# Patient Record
Sex: Female | Born: 1975 | Race: Black or African American | Hispanic: No | Marital: Married | Smoking: Current every day smoker
Health system: Southern US, Community
[De-identification: ages and names within clinical notes are randomized; demographics above are authoritative.]

## PROBLEM LIST (undated history)

## (undated) HISTORY — PX: ABDOMINAL HYSTERECTOMY: SHX81

---

## 2017-04-03 ENCOUNTER — Emergency Department (HOSPITAL_COMMUNITY)
Admission: EM | Admit: 2017-04-03 | Discharge: 2017-04-03 | Disposition: A | Discharge status: Home / Self Care | Attending: Emergency Medicine | Admitting: Emergency Medicine

## 2017-04-03 ENCOUNTER — Emergency Department (HOSPITAL_COMMUNITY)

## 2017-04-03 ENCOUNTER — Encounter (HOSPITAL_COMMUNITY): Payer: Self-pay | Admitting: Nurse Practitioner

## 2017-04-03 DIAGNOSIS — J111 Influenza due to unidentified influenza virus with other respiratory manifestations: Secondary | ICD-10-CM | POA: Insufficient documentation

## 2017-04-03 DIAGNOSIS — F17210 Nicotine dependence, cigarettes, uncomplicated: Secondary | ICD-10-CM | POA: Diagnosis not present

## 2017-04-03 DIAGNOSIS — R1110 Vomiting, unspecified: Secondary | ICD-10-CM | POA: Diagnosis present

## 2017-04-03 DIAGNOSIS — R6889 Other general symptoms and signs: Secondary | ICD-10-CM

## 2017-04-03 LAB — CBC
HEMATOCRIT: 43 % (ref 36.0–46.0)
HEMOGLOBIN: 14.2 g/dL (ref 12.0–15.0)
MCH: 28.2 pg (ref 26.0–34.0)
MCHC: 33 g/dL (ref 30.0–36.0)
MCV: 85.3 fL (ref 78.0–100.0)
Platelets: 254 10*3/uL (ref 150–400)
RBC: 5.04 MIL/uL (ref 3.87–5.11)
RDW: 14.9 % (ref 11.5–15.5)
WBC: 6.2 10*3/uL (ref 4.0–10.5)

## 2017-04-03 LAB — URINALYSIS, ROUTINE W REFLEX MICROSCOPIC
Bilirubin Urine: NEGATIVE
Glucose, UA: NEGATIVE mg/dL
Ketones, ur: NEGATIVE mg/dL
NITRITE: NEGATIVE
PH: 6 (ref 5.0–8.0)
Protein, ur: NEGATIVE mg/dL
SPECIFIC GRAVITY, URINE: 1.014 (ref 1.005–1.030)

## 2017-04-03 LAB — COMPREHENSIVE METABOLIC PANEL
ALBUMIN: 3.3 g/dL — AB (ref 3.5–5.0)
ALK PHOS: 87 U/L (ref 38–126)
ALT: 38 U/L (ref 14–54)
ANION GAP: 13 (ref 5–15)
AST: 71 U/L — AB (ref 15–41)
BILIRUBIN TOTAL: 0.5 mg/dL (ref 0.3–1.2)
BUN: 7 mg/dL (ref 6–20)
CALCIUM: 8.4 mg/dL — AB (ref 8.9–10.3)
CO2: 22 mmol/L (ref 22–32)
Chloride: 106 mmol/L (ref 101–111)
Creatinine, Ser: 0.7 mg/dL (ref 0.44–1.00)
GFR calc Af Amer: >60 mL/min (ref >60)
GFR calc non Af Amer: >60 mL/min (ref >60)
GLUCOSE: 85 mg/dL (ref 65–99)
Potassium: 3.8 mmol/L (ref 3.5–5.1)
SODIUM: 141 mmol/L (ref 135–145)
TOTAL PROTEIN: 6.8 g/dL (ref 6.5–8.1)

## 2017-04-03 LAB — I-STAT BETA HCG BLOOD, ED (MC, WL, AP ONLY)

## 2017-04-03 LAB — LIPASE, BLOOD: Lipase: 26 U/L (ref 11–51)

## 2017-04-03 LAB — RAPID STREP SCREEN (MED CTR MEBANE ONLY): Streptococcus, Group A Screen (Direct): NEGATIVE

## 2017-04-03 MED ORDER — GUAIFENESIN-CODEINE 100-10 MG/5ML PO SOLN: 5.0000 mL | Freq: Every evening | ORAL | 0 refills | Status: AC | PRN

## 2017-04-03 MED ORDER — PREDNISONE 10 MG PO TABS: 20.0000 mg | ORAL_TABLET | Freq: Two times a day (BID) | ORAL | 0 refills | Status: AC

## 2017-04-03 MED ORDER — HYDROCOD POLST-CPM POLST ER 10-8 MG/5ML PO SUER
5.0000 mL | Freq: Once | ORAL | Status: AC
  Administered 2017-04-03: 5 mL via ORAL
  Filled 2017-04-03: qty 5

## 2017-04-03 MED ORDER — ONDANSETRON 4 MG PO TBDP: 4.0000 mg | ORAL_TABLET | Freq: Once | ORAL | Status: DC | PRN

## 2017-04-03 MED ORDER — BENZONATATE 100 MG PO CAPS: 100.0000 mg | ORAL_CAPSULE | Freq: Three times a day (TID) | ORAL | 0 refills | Status: AC

## 2017-04-03 NOTE — ED Provider Notes (Signed)
[facility] EMERGENCY DEPARTMENT Provider Note   CSN: [id number] Arrival date & time: 04/03/17  1103     History   Chief Complaint Chief Complaint  Patient presents with  . Emesis    HPI Yvonne Byrd is a 42 y.o. female who is an every day smoker and hx of abdominal hysterectomy 10/19 presents to the ED with URI symptoms that started 5 days ago and have gotten worse. Patient reports sneezing, coughing, nasal congestion and feeling tired. Patient also reports vomiting with cough only. Patient visiting here from [place].  The history is provided by the patient. No language interpreter was used.  URI   This is a new problem. The current episode started more than 1 week ago. There has been no fever. Associated symptoms include vomiting (with couhg), congestion, ear pain, headaches, plugged ear sensation, rhinorrhea, sneezing, sore throat and cough. Pertinent negatives include no abdominal pain, no nausea, no dysuria, no sinus pain and no rash. She has tried other medications for the symptoms. The treatment provided no relief.    History reviewed. No pertinent past medical history.  There are no active problems to display for this patient.   Past Surgical History:  Procedure Laterality Date  . ABDOMINAL HYSTERECTOMY      OB History    No data available       Home Medications    Prior to Admission medications   Medication Sig Start Date End Date Taking? Authorizing Provider  benzonatate (TESSALON) 100 MG capsule Take 1 capsule (100 mg total) by mouth every 8 (eight) hours. 04/03/17   Janne Napoleon, NP  guaiFENesin-codeine 100-10 MG/5ML syrup Take 5 mLs by mouth at bedtime as needed and may repeat dose one time if needed for cough. 04/03/17   Janne Napoleon, NP  predniSONE (DELTASONE) 10 MG tablet Take 2 tablets (20 mg total) by mouth 2 (two) times daily with a meal. 04/03/17   Janne Napoleon, NP    Family History No family history on file.  Social  History Social History   Tobacco Use  . Smoking status: Current Every Day Smoker    Packs/day: 0.50  . Smokeless tobacco: Never Used  Substance Use Topics  . Alcohol use: Yes    Alcohol/week: 16.8 oz    Types: 28 Cans of beer per week    Frequency: Never  . Drug use: No     Allergies   Asa [aspirin] and Penicillins   Review of Systems Review of Systems  Constitutional: Negative for chills and fever.  HENT: Positive for congestion, ear pain, rhinorrhea, sneezing and sore throat. Negative for sinus pain.   Eyes: Negative for pain, redness and itching.  Respiratory: Positive for cough. Negative for shortness of breath.   Gastrointestinal: Positive for vomiting (with couhg). Negative for abdominal pain and nausea.  Genitourinary: Negative for decreased urine volume, dysuria and frequency.  Skin: Negative for rash.  Neurological: Positive for light-headedness and headaches. Negative for syncope.  Psychiatric/Behavioral: Negative for confusion.     Physical Exam Updated Vital Signs BP 101/87 (BP Location: Right Arm)   Pulse 87   Temp 98.5 F (36.9 C) (Oral)   Resp 16   Ht 5\' 7"  (1.702 m)   Wt 95.3 kg (210 lb)   SpO2 99%   BMI 32.89 kg/m   Physical Exam  Constitutional: She appears well-developed and well-nourished. No distress.  HENT:  Head: Normocephalic.  Right Ear: Tympanic membrane normal.  Left Ear: Tympanic membrane  normal.  Nose: Mucosal edema and rhinorrhea present.  Mouth/Throat: Uvula is midline and oropharynx is clear and moist.  Eyes: EOM are normal.  Neck: Normal range of motion. Neck supple.  Cardiovascular: Normal rate and regular rhythm.  Pulmonary/Chest: Effort normal and breath sounds normal.  Abdominal: Soft. Bowel sounds are normal. There is no tenderness.  Musculoskeletal: Normal range of motion.  Lymphadenopathy:    She has no cervical adenopathy.  Neurological: She is alert.  Skin: Skin is warm and dry.  Psychiatric: She has a normal  mood and affect. Her behavior is normal.  Nursing note and vitals reviewed.    ED Treatments / Results  Labs (all labs ordered are listed, but only abnormal results are displayed) Labs Reviewed  COMPREHENSIVE METABOLIC PANEL - Abnormal; Notable for the following components:      Result Value   Calcium 8.4 (*)    Albumin 3.3 (*)    AST 71 (*)    All other components within normal limits  URINALYSIS, ROUTINE W REFLEX MICROSCOPIC - Abnormal; Notable for the following components:   APPearance HAZY (*)    Hgb urine dipstick SMALL (*)    Leukocytes, UA TRACE (*)    Bacteria, UA RARE (*)    Squamous Epithelial / LPF 6-30 (*)    All other components within normal limits  RAPID STREP SCREEN (NOT [facility])  CULTURE, GROUP A STREP ([facility])  LIPASE, BLOOD  CBC  I-STAT BETA HCG BLOOD, ED ([facility], [facility], [facility] ONLY)    Radiology Dg Chest 2 View  Result Date: 04/03/2017 CLINICAL DATA:  Upper respiratory symptoms. EXAM: CHEST  2 VIEW COMPARISON:  None. FINDINGS: The lungs are clear without focal pneumonia, edema, pneumothorax or pleural effusion. The cardiopericardial silhouette is within normal limits for size. The visualized bony structures of the thorax are intact. IMPRESSION: No active cardiopulmonary disease. Electronically Signed   By: Kennith CenterEric  Mansell M.D.   On: 04/03/2017 14:35    Procedures Procedures (including critical care time)  Medications Ordered in ED Medications  chlorpheniramine-HYDROcodone (TUSSIONEX) 10-8 MG/5ML suspension 5 mL (5 mLs Oral Given 04/03/17 1446)     Initial Impression / Assessment and Plan / ED Course  I have reviewed the triage vital signs and the nursing notes. Pt CXR negative for acute infiltrate. Patients symptoms are consistent with bronchitis likely viral etiology. Discussed that antibiotics are not indicated for viral infections. Pt will be discharged with symptomatic treatment.  Verbalizes understanding and is agreeable with plan. Pt is hemodynamically stable &  in NAD prior to dc.  Final Clinical Impressions(s) / ED Diagnoses   Final diagnoses:  Flu-like symptoms    ED Discharge Orders        Ordered    benzonatate (TESSALON) 100 MG capsule  Every 8 hours     04/03/17 1446    predniSONE (DELTASONE) 10 MG tablet  2 times daily with meals     04/03/17 1446    guaiFENesin-codeine 100-10 MG/5ML syrup  At bedtime PRN and repeat x1 PRN     04/03/17 1446       Kerrie Buffaloeese, Nivin Braniff LowmanM, NP 04/03/17 2111    Vanetta MuldersZackowski, Scott, MD 04/04/17 2155

## 2017-04-03 NOTE — Discharge Instructions (Signed)
Your chest x-ray shows no pneumonia. You lab work does show your liver enzyme to be slightly elevated. This is something that will need to be recheck by your primary care doctor. Schedule an appointment for follow up.

## 2017-04-03 NOTE — ED Triage Notes (Signed)
Pt sts Friday started having URI symptoms- sneezing and coughing, nasal congestion. Pt sts progressively increased and has been fatigued and endorses worsening dizziness. Pt endorses nausea, vomiting.

## 2017-04-05 LAB — CULTURE, GROUP A STREP (THRC)

## 2019-01-30 IMAGING — DX DG CHEST 2V
2 series · 2 of 2 positions shown · non-contrast
Comparison: None.

CLINICAL DATA: Upper respiratory symptoms.

EXAM:
CHEST  2 VIEW

[chest pa]
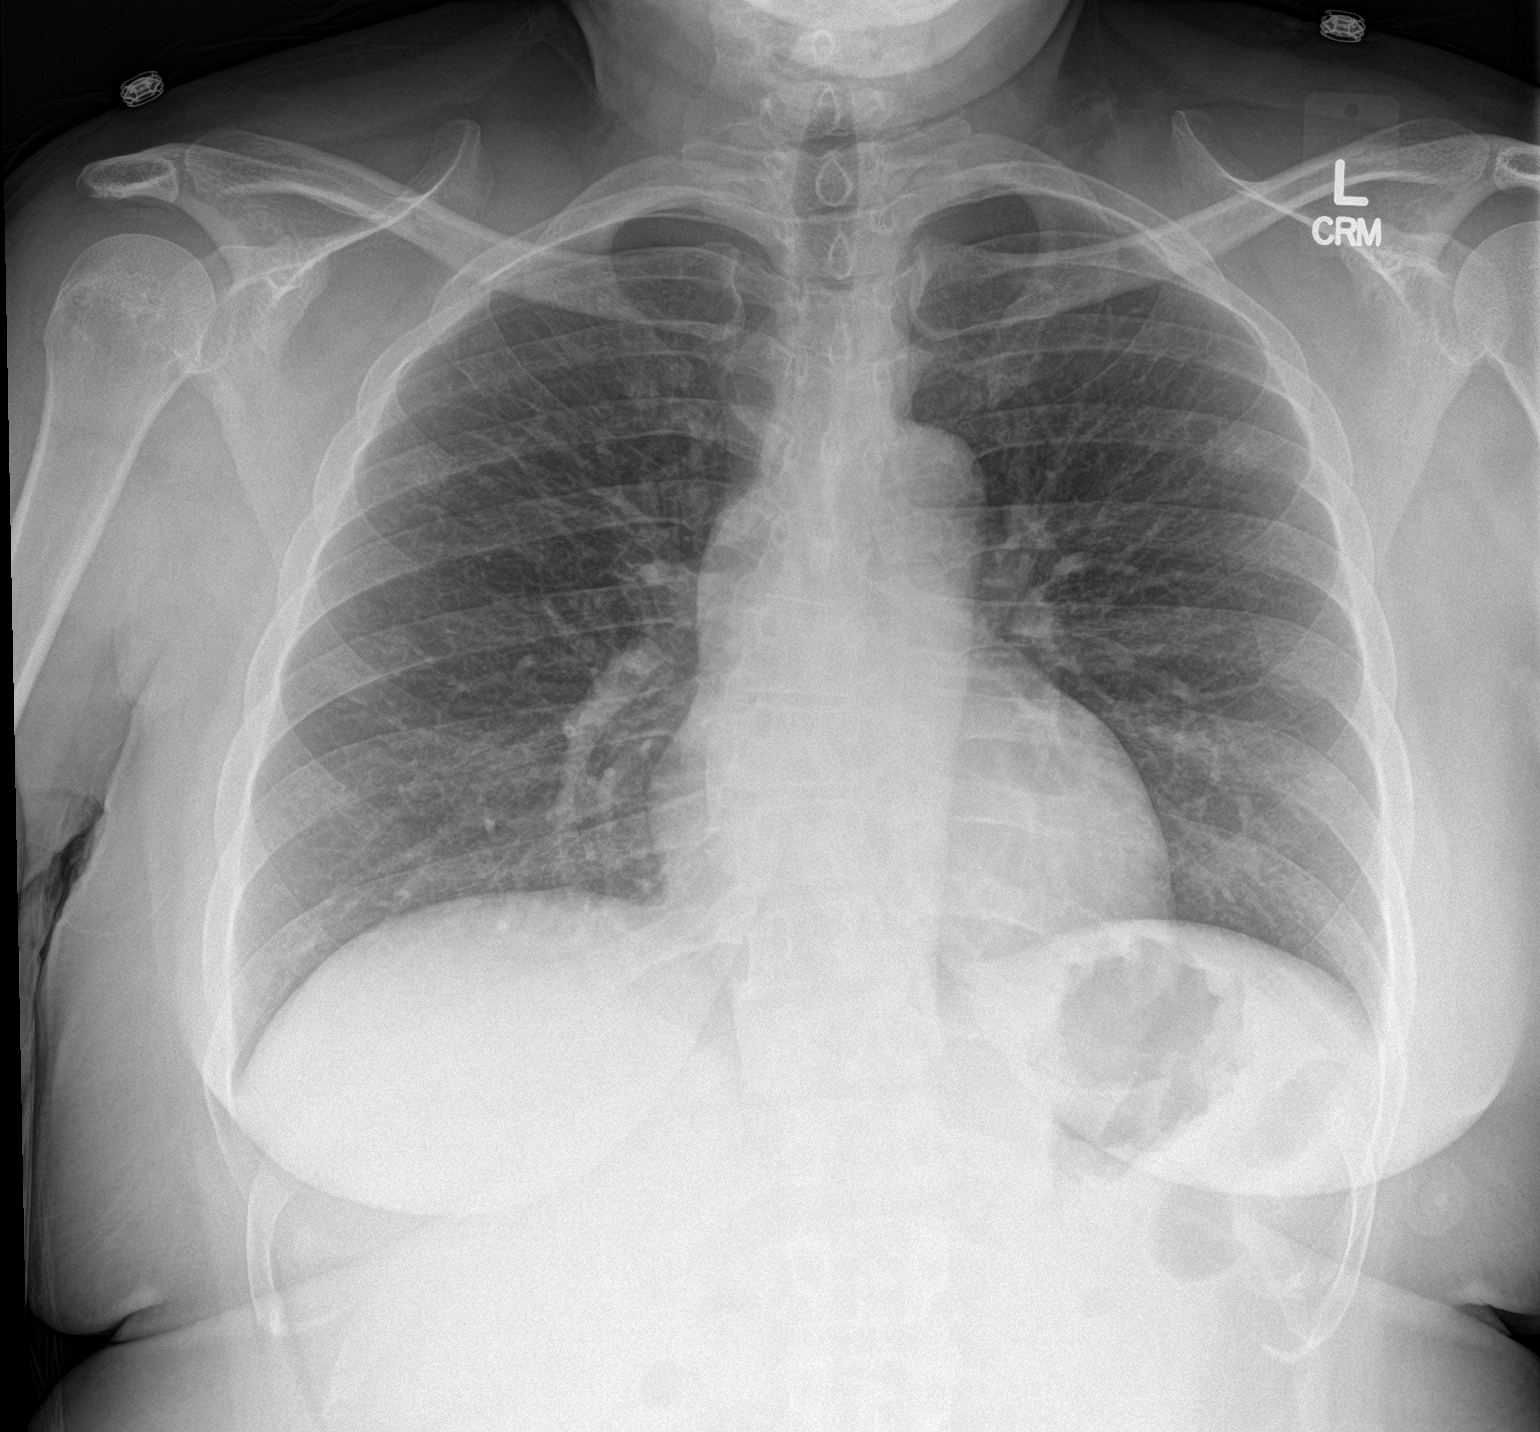

[chest lat]
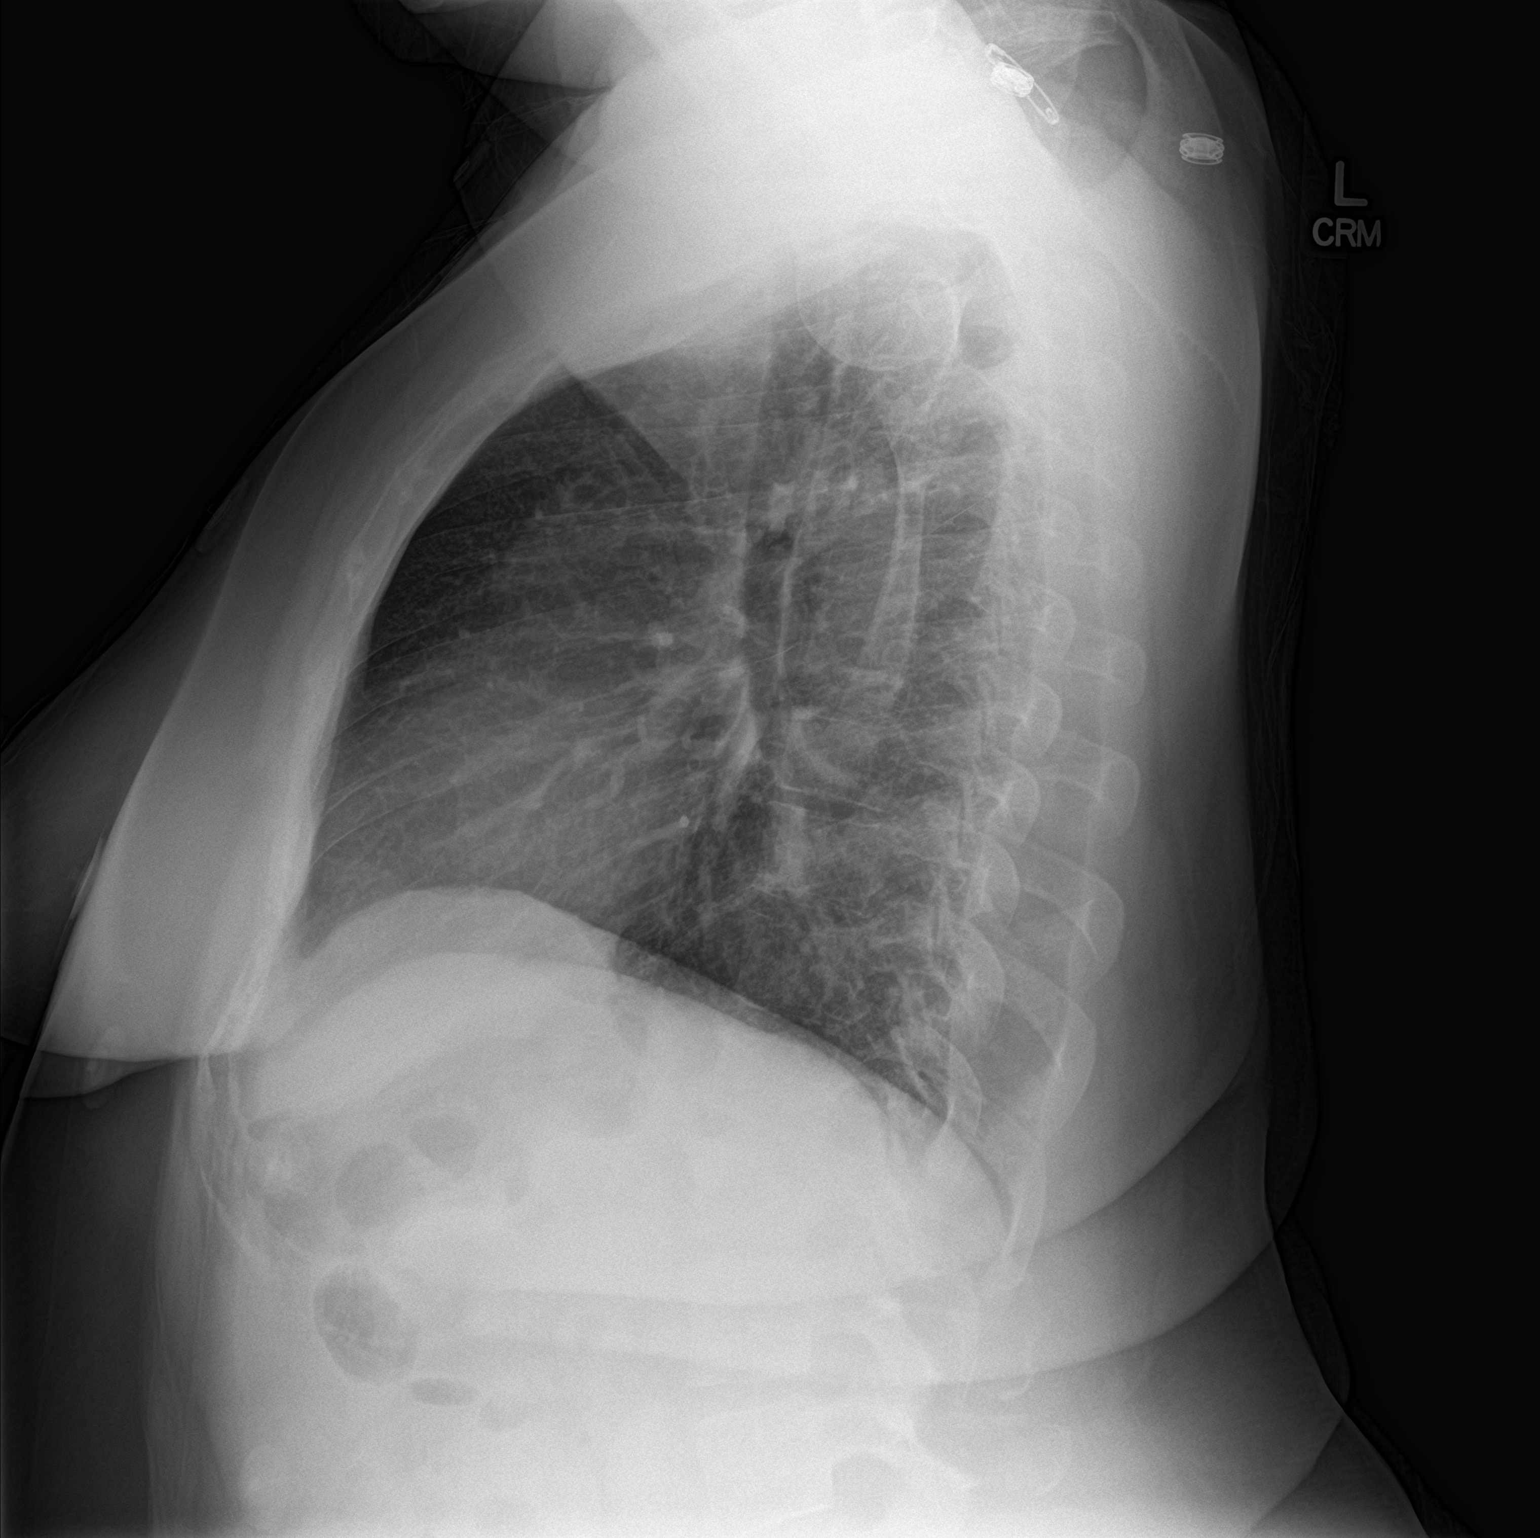

[2 of 2 positions shown; findings below may reference images not displayed]

FINDINGS: The lungs are clear without focal pneumonia, edema, pneumothorax or
pleural effusion. The cardiopericardial silhouette is within normal
limits for size. The visualized bony structures of the thorax are
intact.
IMPRESSION: No active cardiopulmonary disease.
